# Patient Record
Sex: Male | Born: 2005 | Race: White | Hispanic: Yes | Marital: Single | Smoking: Never smoker
Health system: Southern US, Community
[De-identification: ages and names within clinical notes are randomized; demographics above are authoritative.]

## PROBLEM LIST (undated history)

## (undated) DIAGNOSIS — B350 Tinea barbae and tinea capitis: Secondary | ICD-10-CM

## (undated) DIAGNOSIS — R21 Rash and other nonspecific skin eruption: Secondary | ICD-10-CM

## (undated) DIAGNOSIS — N471 Phimosis: Secondary | ICD-10-CM

## (undated) DIAGNOSIS — L219 Seborrheic dermatitis, unspecified: Secondary | ICD-10-CM

## (undated) HISTORY — PX: APPENDECTOMY: SHX54

## (undated) HISTORY — DX: Seborrheic dermatitis, unspecified: L21.9

## (undated) HISTORY — DX: Phimosis: N47.1

## (undated) HISTORY — DX: Rash and other nonspecific skin eruption: R21

## (undated) HISTORY — DX: Tinea barbae and tinea capitis: B35.0

---
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
## 2024-04-27 ENCOUNTER — Other Ambulatory Visit: Payer: Self-pay

## 2024-04-27 ENCOUNTER — Encounter (HOSPITAL_COMMUNITY): Payer: Self-pay

## 2024-04-27 ENCOUNTER — Emergency Department (HOSPITAL_COMMUNITY)

## 2024-04-27 ENCOUNTER — Emergency Department (HOSPITAL_COMMUNITY)
Admission: EM | Admit: 2024-04-27 | Discharge: 2024-04-28 | Disposition: A | Discharge status: Home / Self Care | Attending: Student in an Organized Health Care Education/Training Program | Admitting: Student in an Organized Health Care Education/Training Program

## 2024-04-27 DIAGNOSIS — S61215A Laceration without foreign body of left ring finger without damage to nail, initial encounter: Secondary | ICD-10-CM | POA: Diagnosis not present

## 2024-04-27 DIAGNOSIS — R58 Hemorrhage, not elsewhere classified: Secondary | ICD-10-CM | POA: Diagnosis not present

## 2024-04-27 DIAGNOSIS — S61213A Laceration without foreign body of left middle finger without damage to nail, initial encounter: Secondary | ICD-10-CM | POA: Diagnosis not present

## 2024-04-27 DIAGNOSIS — S0101XA Laceration without foreign body of scalp, initial encounter: Secondary | ICD-10-CM | POA: Insufficient documentation

## 2024-04-27 DIAGNOSIS — Z23 Encounter for immunization: Secondary | ICD-10-CM | POA: Insufficient documentation

## 2024-04-27 DIAGNOSIS — S0990XA Unspecified injury of head, initial encounter: Secondary | ICD-10-CM | POA: Diagnosis not present

## 2024-04-27 DIAGNOSIS — W19XXXA Unspecified fall, initial encounter: Secondary | ICD-10-CM | POA: Diagnosis not present

## 2024-04-27 DIAGNOSIS — R55 Syncope and collapse: Secondary | ICD-10-CM | POA: Insufficient documentation

## 2024-04-27 DIAGNOSIS — Y990 Civilian activity done for income or pay: Secondary | ICD-10-CM | POA: Diagnosis not present

## 2024-04-27 DIAGNOSIS — W01198A Fall on same level from slipping, tripping and stumbling with subsequent striking against other object, initial encounter: Secondary | ICD-10-CM | POA: Diagnosis not present

## 2024-04-27 DIAGNOSIS — R569 Unspecified convulsions: Secondary | ICD-10-CM | POA: Diagnosis not present

## 2024-04-27 LAB — COMPREHENSIVE METABOLIC PANEL WITH GFR
ALT: 18 U/L (ref 0–44)
AST: 27 U/L (ref 15–41)
Albumin: 4.6 g/dL (ref 3.5–5.0)
Alkaline Phosphatase: 69 U/L (ref 38–126)
Anion gap: 14 (ref 5–15)
BUN: 16 mg/dL (ref 6–20)
CO2: 20 mmol/L — ABNORMAL LOW (ref 22–32)
Calcium: 9.4 mg/dL (ref 8.9–10.3)
Chloride: 104 mmol/L (ref 98–111)
Creatinine, Ser: 0.89 mg/dL (ref 0.61–1.24)
GFR, Estimated: >60 mL/min (ref >60)
Glucose, Bld: 96 mg/dL (ref 70–99)
Potassium: 4.2 mmol/L (ref 3.5–5.1)
Sodium: 138 mmol/L (ref 135–145)
Total Bilirubin: 1.2 mg/dL (ref 0.0–1.2)
Total Protein: 7.7 g/dL (ref 6.5–8.1)

## 2024-04-27 MED ORDER — LIDOCAINE-EPINEPHRINE (PF) 2 %-1:200000 IJ SOLN
20.0000 mL | Freq: Once | INTRAMUSCULAR | Status: AC
  Administered 2024-04-27: 20 mL
  Filled 2024-04-27: qty 20

## 2024-04-27 MED ORDER — TETANUS-DIPHTH-ACELL PERTUSSIS 5-2-15.5 LF-MCG/0.5 IM SUSP
0.5000 mL | Freq: Once | INTRAMUSCULAR | Status: AC
  Administered 2024-04-27: 0.5 mL via INTRAMUSCULAR
  Filled 2024-04-27: qty 0.5

## 2024-04-27 NOTE — ED Triage Notes (Signed)
 Pt BIBA from work, pt cut finger, was wrapping finger and had 15 second seizure per EMS report Pt has hx of 2 prior seizures, no f/u care. Pt AAO on arrival. Pt does not take any medications regularly. Pt w/ small head lac, fall from standing

## 2024-04-27 NOTE — ED Provider Notes (Signed)
 [facility]PT [facility] Emergency Department Provider Note MRN:  [id number]  Arrival date & time: 04/28/24     Chief Complaint   Seizures   History of Present Illness   Dennis Olson is a 18 y.o. year-old male presents to the ED with chief complaint of head injury.  Patient states that he cut his fingers while at work, while he was looking at the wound and the blood, he passed out, fell backward, and hit his head.  He sustained a laceration to the crown of his head.  He has 2 small and shallow lacerations to the left middle and ring fingers.  Initially this was reported as seizure, but he says nobody witnessed any seizure-like activity.  Patient doesn't have diagnosed seizure disorder.  He doesn't take any medications.  EMS report to RN was that patient had 15 second seizure.  History provided by patient.   Review of Systems  Pertinent positive and negative review of systems noted in HPI.    Physical Exam   Vitals:   04/27/24 2231 04/28/24 0121  BP: (!) 136/91 132/79  Pulse: 88 80  Resp: 18   Temp:    SpO2: 100% 100%    CONSTITUTIONAL:  non toxic-appearing, NAD NEURO:  Alert and oriented x 3, CN 3-12 grossly intact EYES:  eyes equal and reactive ENT/NECK:  Supple, no stridor  CARDIO:  normal rate, regular rhythm, appears well-perfused  PULM:  No respiratory distress, CTAB GI/GU:  non-distended,  MSK/SPINE:  No gross deformities, no edema, moves all extremities  SKIN:  no rash, 2 cm laceration to the crown of scalp, (2) shallow 0.5 cm lacerations on the left posterior ring and middle fingers   *Additional and/or pertinent findings included in MDM below  Diagnostic and Interventional Summary    EKG Interpretation Date/Time:    Ventricular Rate:    PR Interval:    QRS Duration:    QT Interval:    QTC Calculation:   R Axis:      Text Interpretation:         Labs Reviewed  COMPREHENSIVE METABOLIC PANEL WITH GFR - Abnormal; Notable for the  following components:      Result Value   CO2 20 (*)    All other components within normal limits  CBC WITH DIFFERENTIAL/PLATELET - Abnormal; Notable for the following components:   WBC 10.9 (*)    Neutro Abs 9.5 (*)    All other components within normal limits  CBC WITH DIFFERENTIAL/PLATELET    CT HEAD WO CONTRAST ( )  Final Result      Medications  lidocaine-EPINEPHrine (XYLOCAINE W/EPI) 2 %-1:200000 (PF) injection 20 mL (20 mLs Infiltration Given by Other 04/27/24 2319)  Tdap (ADACEL) injection 0.5 mL (0.5 mLs Intramuscular Given 04/27/24 2318)     Procedures  /  Critical Care .Laceration Repair  Date/Time: 04/27/2024 11:28 PM  Performed by: Vicky Charleston, PA-C Authorized by: Vicky Charleston, PA-C   Consent:    Consent obtained:  Verbal   Consent given by:  Patient   Risks, benefits, and alternatives were discussed: yes     Risks discussed:  Infection, pain, poor cosmetic result, poor wound healing and need for additional repair   Alternatives discussed:  No treatment Universal protocol:    Procedure explained and questions answered to patient or proxy's satisfaction: yes     Relevant documents present and verified: yes     Test results available: yes     Imaging studies available: yes  Required blood products, implants, devices, and special equipment available: yes     Site/side marked: yes     Immediately prior to procedure, a time out was called: yes     Patient identity confirmed:  Verbally with patient Anesthesia:    Anesthesia method:  Local infiltration   Local anesthetic:  Lidocaine 1% WITH epi Laceration details:    Location:  Scalp   Length (cm):  2 Pre-procedure details:    Preparation:  Patient was prepped and draped in usual sterile fashion and imaging obtained to evaluate for foreign bodies Exploration:    Imaging outcome: foreign body not noted     Contaminated: no   Treatment:    Area cleansed with:  Saline   Amount of cleaning:   Standard Skin repair:    Repair method:  Staples   Number of staples:  3 Approximation:    Approximation:  Close Repair type:    Repair type:  Simple Post-procedure details:    Dressing:  Open (no dressing)   Procedure completion:  Tolerated well, no immediate complications .Laceration Repair  Date/Time: 04/27/2024 11:29 PM  Performed by: Vicky Charleston, PA-C Authorized by: Vicky Charleston, PA-C   Consent:    Consent obtained:  Verbal   Consent given by:  Patient   Risks, benefits, and alternatives were discussed: yes     Risks discussed:  Infection, pain, poor cosmetic result and poor wound healing   Alternatives discussed:  No treatment Universal protocol:    Procedure explained and questions answered to patient or proxy's satisfaction: yes     Relevant documents present and verified: yes     Test results available: yes     Imaging studies available: yes     Required blood products, implants, devices, and special equipment available: yes     Site/side marked: yes     Immediately prior to procedure, a time out was called: yes     Patient identity confirmed:  Verbally with patient Anesthesia:    Anesthesia method:  None Laceration details:    Location:  Finger   Finger location: left middle and ring.   Length (cm):  1 Exploration:    Wound extent: fascia not violated, no foreign body, no signs of injury, no nerve damage, no tendon damage and no vascular damage   Treatment:    Area cleansed with:  Saline Skin repair:    Repair method:  Tissue adhesive Approximation:    Approximation:  Close Repair type:    Repair type:  Simple Post-procedure details:    Dressing:  Open (no dressing)   Procedure completion:  Tolerated well, no immediate complications   ED Course and Medical Decision Making  I have reviewed the triage vital signs, the nursing notes, and pertinent available records from the EMR.  Social Determinants Affecting Complexity of Care: Patient has no  clinically significant social determinants affecting this chief complaint..   ED Course:    Medical Decision Making Patient here with head injury.  States that he had initially cut his finger at work.  Was looking at the wound and the blood and became lightheaded and passed out and fell backward and hit his head.  He sustained a 3 cm head laceration.  Bleeding is controlled.  EMS reported that it was thought he had a seizure, but to patient's knowledge, nobody witnessed any seizure like activity.  I suspect that he vagaled 2/2 seeing the blood on his cut finger and passed out.  He looks well  now.  He is not post ictal.  No tongue biting or urinary incontinence.  I doubt seizure.  CT head is normal.  Lac repaired with staples.  Small cuts on fingers repaired with dermabond.  VSS.  Will discharge home.  Amount and/or Complexity of Data Reviewed Labs: ordered. Radiology: ordered.  Risk Prescription drug management.         Consultants: No consultations were needed in caring for this patient.   Treatment and Plan: Emergency department workup does not suggest an emergent condition requiring admission or immediate intervention beyond  what has been performed at this time. The patient is safe for discharge and has  been instructed to return immediately for worsening symptoms, change in  symptoms or any other concerns    Final Clinical Impressions(s) / ED Diagnoses     ICD-10-CM   1. Syncope, unspecified syncope type  R55     2. Injury of head, initial encounter  S09.90XA       ED Discharge Orders     None         Discharge Instructions Discussed with and Provided to Patient:     Discharge Instructions      You need to have your staples taken out in 7 to 10 days.  Your primary care doctor can do this, or you can return here, or to an urgent care.  Return for new or worsening symptoms.  Your blood work and CT scan of your head looked good.       Vicky Charleston, PA-C 04/28/24 9547    Jerral Meth, MD 04/28/24 [phone]

## 2024-04-28 LAB — CBC WITH DIFFERENTIAL/PLATELET
Abs Immature Granulocytes: 0.04 K/uL (ref 0.00–0.07)
Basophils Absolute: 0 K/uL (ref 0.0–0.1)
Basophils Relative: 0 %
Eosinophils Absolute: 0 K/uL (ref 0.0–0.5)
Eosinophils Relative: 0 %
HCT: 42.3 % (ref 39.0–52.0)
Hemoglobin: 14.7 g/dL (ref 13.0–17.0)
Immature Granulocytes: 0 %
Lymphocytes Relative: 7 %
Lymphs Abs: 0.8 K/uL (ref 0.7–4.0)
MCH: 29.9 pg (ref 26.0–34.0)
MCHC: 34.8 g/dL (ref 30.0–36.0)
MCV: 86.2 fL (ref 80.0–100.0)
Monocytes Absolute: 0.5 K/uL (ref 0.1–1.0)
Monocytes Relative: 4 %
Neutro Abs: 9.5 K/uL — ABNORMAL HIGH (ref 1.7–7.7)
Neutrophils Relative %: 89 %
Platelets: 272 K/uL (ref 150–400)
RBC: 4.91 MIL/uL (ref 4.22–5.81)
RDW: 12.1 % (ref 11.5–15.5)
WBC: 10.9 K/uL — ABNORMAL HIGH (ref 4.0–10.5)
nRBC: 0 % (ref 0.0–0.2)

## 2024-04-28 NOTE — Discharge Instructions (Addendum)
 You need to have your staples taken out in 7 to 10 days.  Your primary care doctor can do this, or you can return here, or to an urgent care.  Return for new or worsening symptoms.  Your blood work and CT scan of your head looked good.

## 2024-07-17 ENCOUNTER — Ambulatory Visit: Payer: Self-pay | Admitting: Student

## 2024-07-17 ENCOUNTER — Inpatient Hospital Stay
Admission: RE | Admit: 2024-07-17 | Discharge: 2024-07-17 | Disposition: A | Payer: Self-pay | Source: Ambulatory Visit | Attending: Family Medicine | Admitting: Family Medicine

## 2024-07-17 VITALS — BP 97/56 | HR 51 | Temp 98.0°F | Ht 61.0 in | Wt 150.0 lb

## 2024-07-17 DIAGNOSIS — Z189 Retained foreign body fragments, unspecified material: Secondary | ICD-10-CM

## 2024-07-17 NOTE — Progress Notes (Signed)
" ° ° °  SUBJECTIVE:   CHIEF COMPLAINT / HPI:   Discussed the use of AI scribe software for clinical note transcription with the patient, who gave verbal consent to proceed.  History of Present Illness Dennis Olson is a 19 year old male who presents with concerns about staples in his head following a head injury.  Scalp laceration and staples - Sustained a scalp laceration on October 25th after fainting and striking head on the floor - Staples were placed in the emergency room for wound closure - Advised to have staples removed after 10 days, but nearly three months have elapsed without removal - No pain, drainage, or bleeding at the staple site - Unable to see or feel any staples and is uncertain if he has already fallen out   OBJECTIVE:   BP (!) 97/56   Pulse (!) 51   Temp 98 F (36.7 C) (Oral)   Ht 5' 1 (1.549 m)   Wt 150 lb (68 kg)   SpO2 99%   BMI 28.34 kg/m    Physical Exam HEENT: No staples palpable in scalp. No bleeding, drainage, or infection in scalp.  ASSESSMENT/PLAN:   Assessment & Plan Retained foreign body -Suspect staples may have fallen out from scalp washing, however to be certain there is no retained foreign body we will obtain plain films. - Will review plain films, if stable or retained will send referral to surgery for removal  Gladis Church, DO [facility]  "

## 2024-07-17 NOTE — Patient Instructions (Signed)
 It was great to see you! Thank you for allowing me to participate in your care!   I recommend that you always bring your medications to each appointment as this makes it easy to ensure we are on the correct medications and helps us  not miss when refills are needed.  Our plans for today:   An x-ray was ordered for you---you do not need an appointment to have this completed.  I recommend going to [facility] [address] [place]   If the results are normal,I will send you a letter  I will call you with results if anything is abnormal    Take care and seek immediate care sooner if you develop any concerns. Please remember to show up 15 minutes before your scheduled appointment time!  Gladis Church, DO [facility]
